# Patient Record
Sex: Male | Born: 1990 | Hispanic: Yes | Marital: Single | Smoking: Light tobacco smoker
Health system: Southern US, Community
[De-identification: ages and names within clinical notes are randomized; demographics above are authoritative.]

---
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
## 2011-02-13 ENCOUNTER — Emergency Department (HOSPITAL_COMMUNITY)
Admission: EM | Admit: 2011-02-13 | Discharge: 2011-02-13 | Disposition: A | Discharge status: Home / Self Care | Payer: Self-pay | Attending: Emergency Medicine | Admitting: Emergency Medicine

## 2011-02-13 ENCOUNTER — Emergency Department (HOSPITAL_COMMUNITY): Payer: Self-pay

## 2011-02-13 DIAGNOSIS — Y92009 Unspecified place in unspecified non-institutional (private) residence as the place of occurrence of the external cause: Secondary | ICD-10-CM | POA: Insufficient documentation

## 2011-02-13 DIAGNOSIS — R209 Unspecified disturbances of skin sensation: Secondary | ICD-10-CM | POA: Insufficient documentation

## 2011-02-13 DIAGNOSIS — M7989 Other specified soft tissue disorders: Secondary | ICD-10-CM | POA: Insufficient documentation

## 2011-02-13 DIAGNOSIS — M79609 Pain in unspecified limb: Secondary | ICD-10-CM | POA: Insufficient documentation

## 2011-02-13 DIAGNOSIS — IMO0002 Reserved for concepts with insufficient information to code with codable children: Secondary | ICD-10-CM | POA: Insufficient documentation

## 2011-02-13 DIAGNOSIS — M25649 Stiffness of unspecified hand, not elsewhere classified: Secondary | ICD-10-CM | POA: Insufficient documentation

## 2011-02-13 DIAGNOSIS — S62339A Displaced fracture of neck of unspecified metacarpal bone, initial encounter for closed fracture: Secondary | ICD-10-CM | POA: Insufficient documentation

## 2011-02-13 DIAGNOSIS — S60229A Contusion of unspecified hand, initial encounter: Secondary | ICD-10-CM | POA: Insufficient documentation

## 2012-01-04 IMAGING — CR DG HAND COMPLETE 3+V*R*
3 series · 3 of 3 positions shown · non-contrast
Comparison: None.

CLINICAL DATA: Trauma, pain

RIGHT HAND - COMPLETE 3+ VIEW

[x hand pa right]
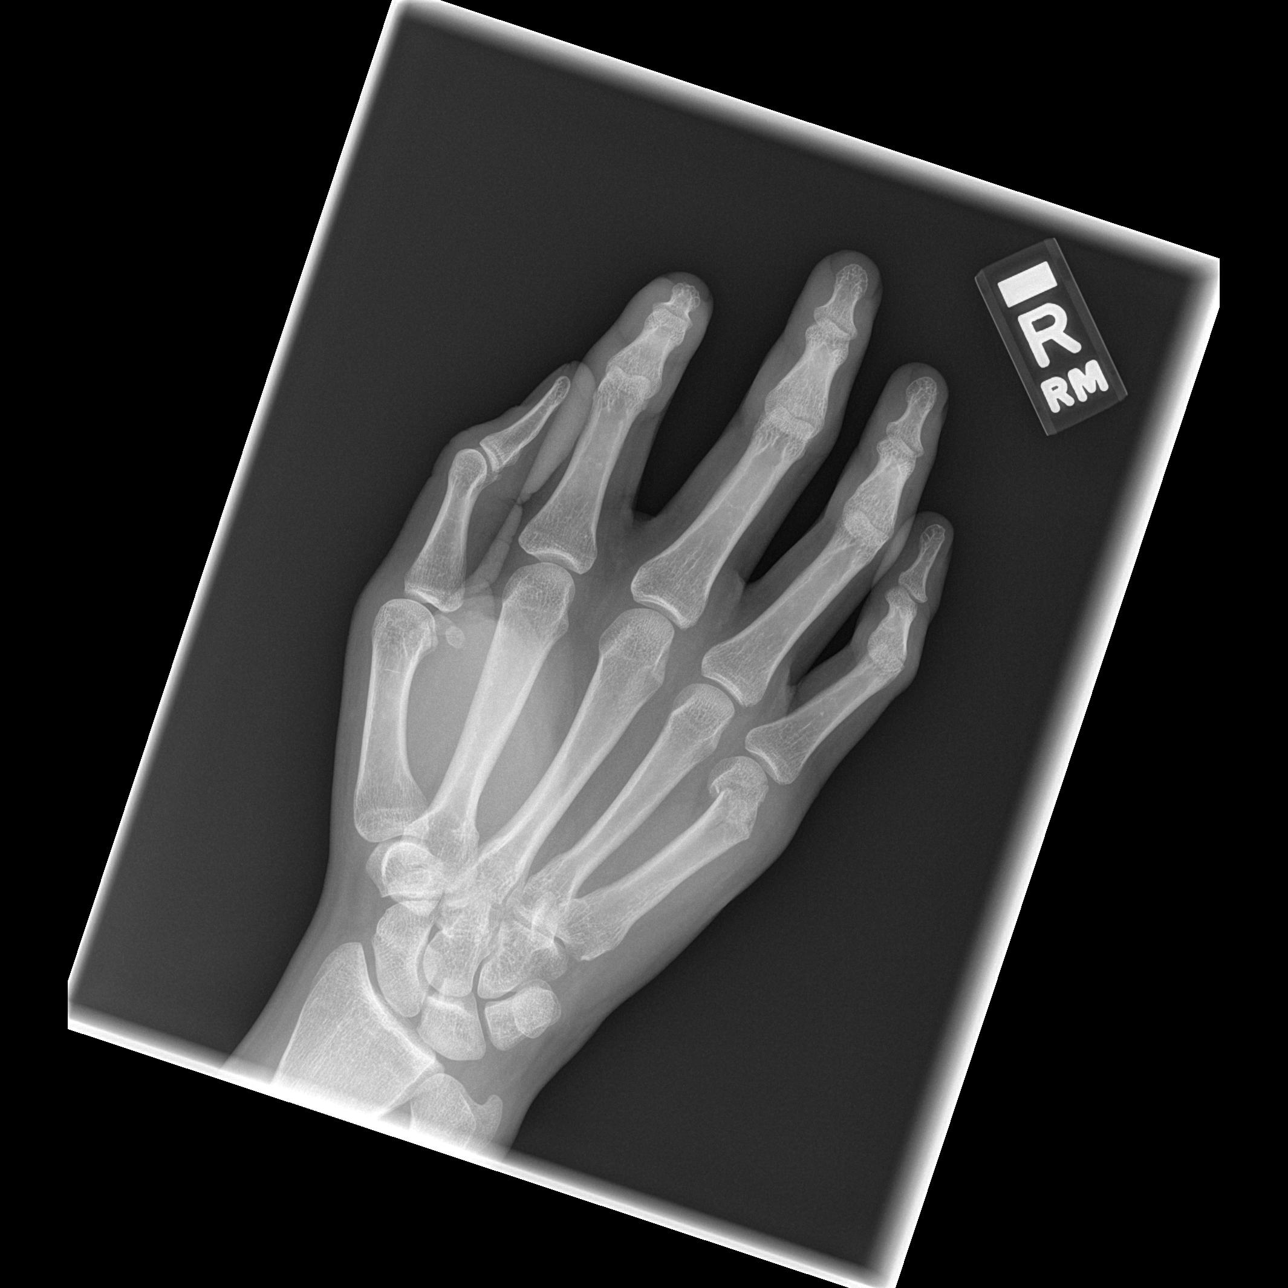

[x hand oblique right]
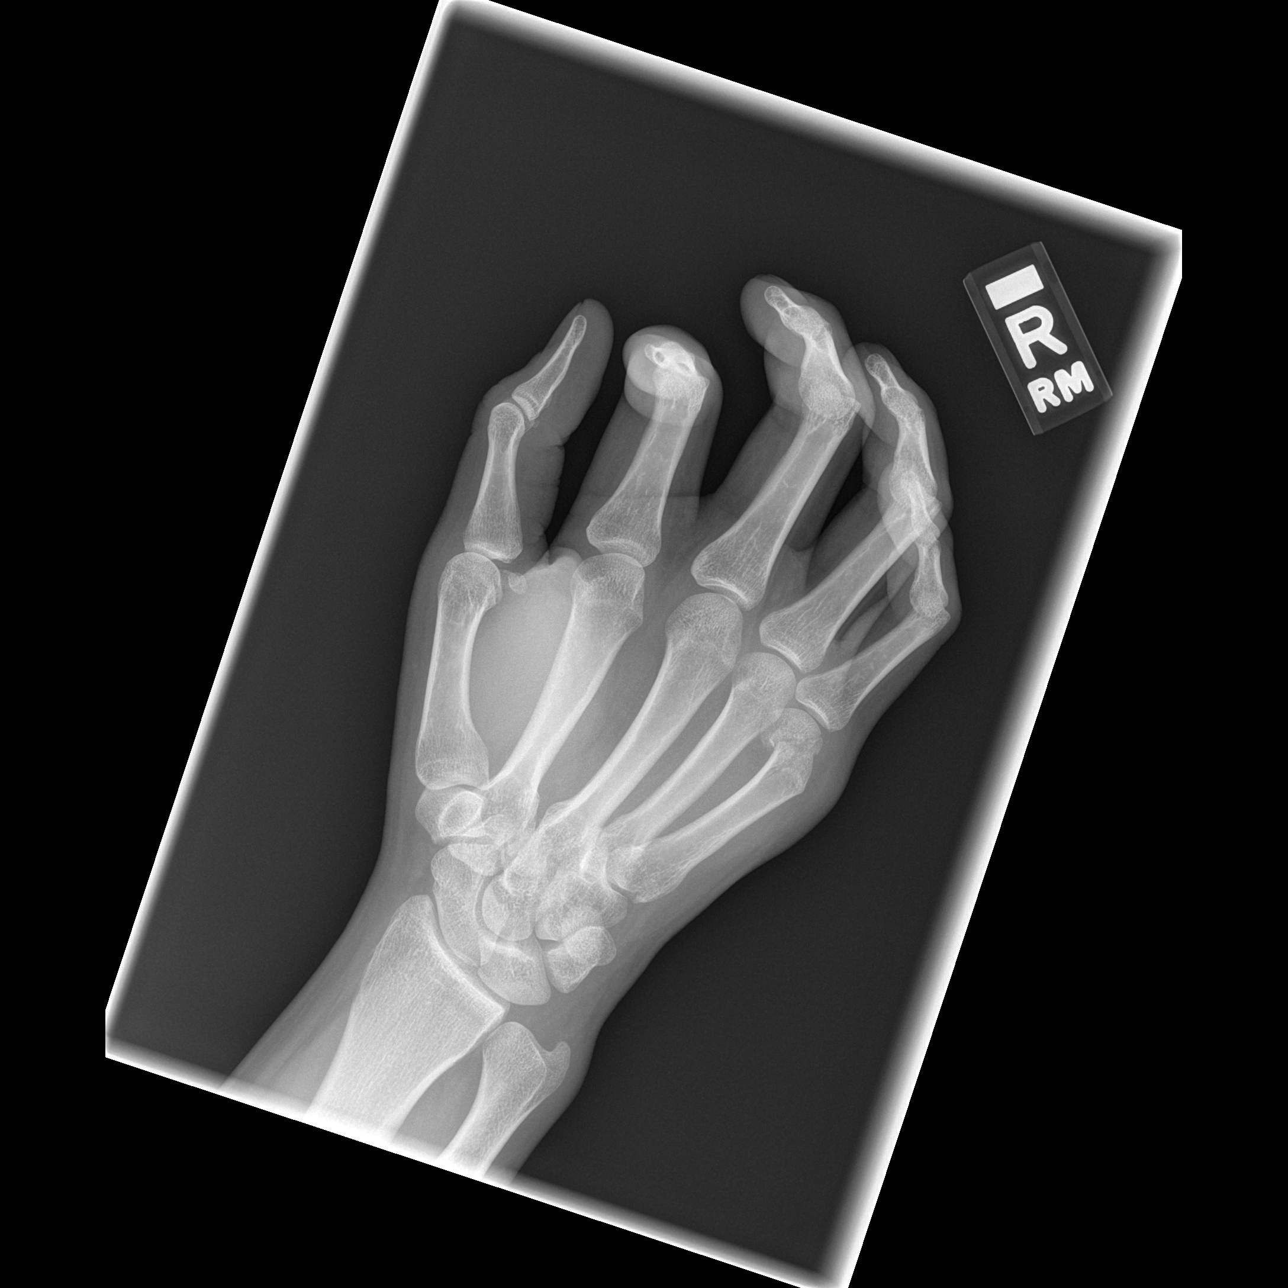

[x hand lat right]
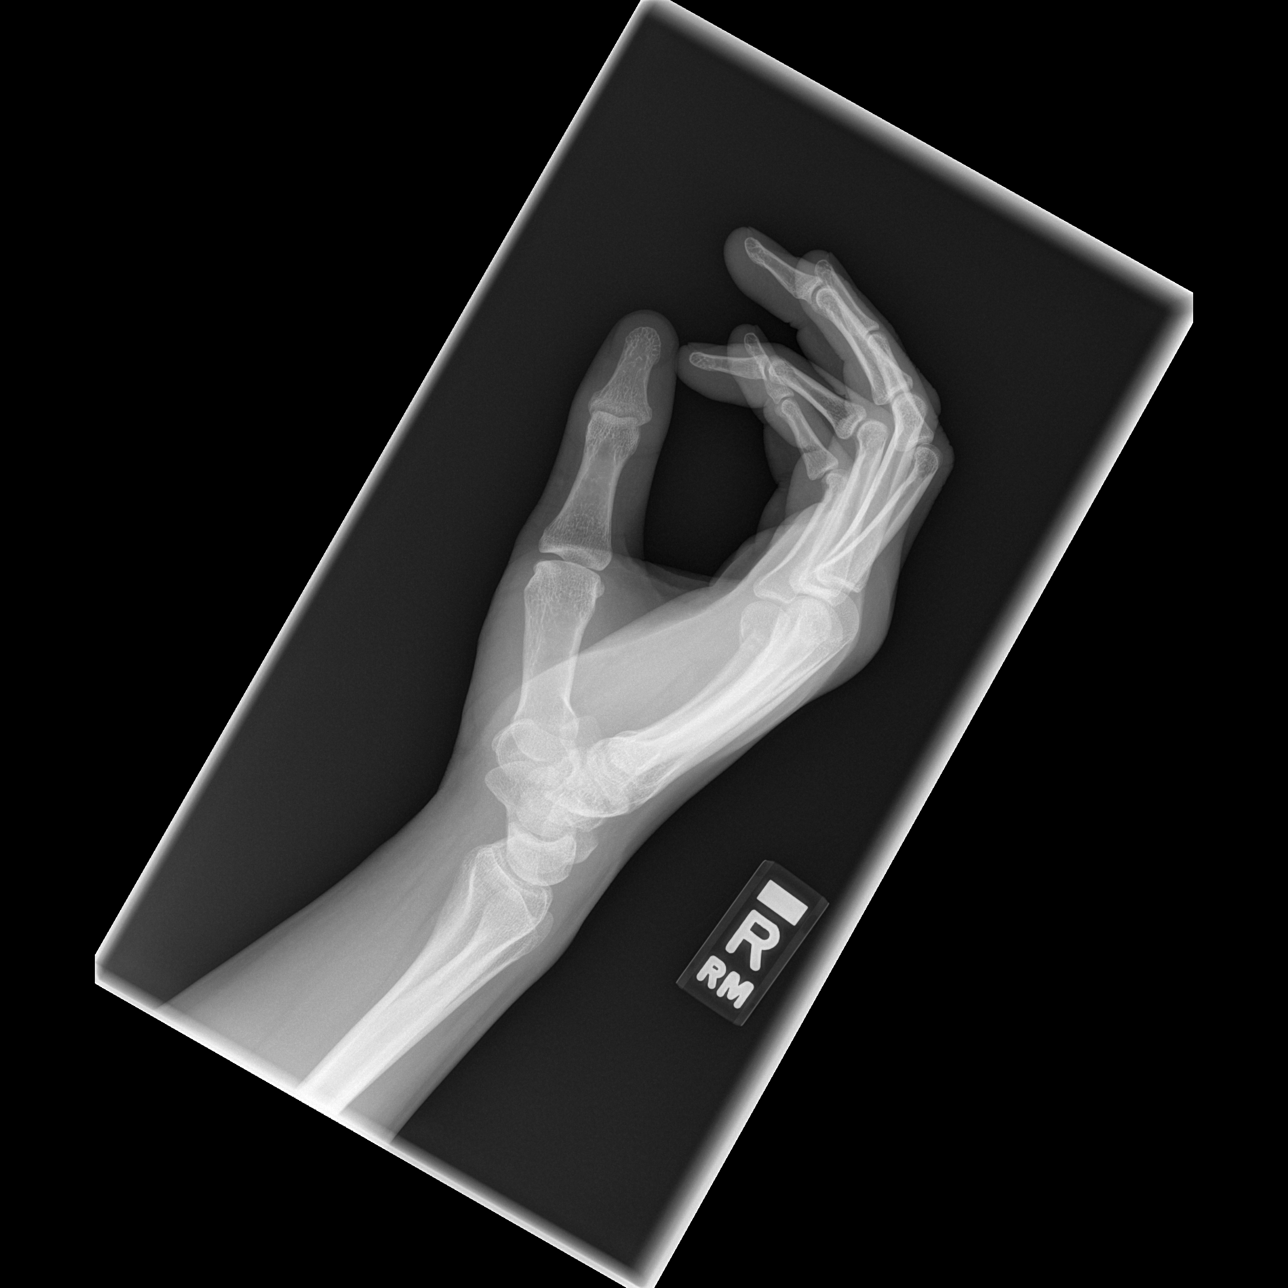

[3 of 3 positions shown; findings below may reference images not displayed]

FINDINGS: Acute angulated displaced fracture of the right fifth
metacarpal noted distally consistent with a "boxer's fracture."
Slight volar angulation of the distal fracture fragment.  Mild soft
tissue swelling.  No other osseous abnormality.
IMPRESSION: Acute right distal fifth metacarpal fracture.

## 2020-04-09 ENCOUNTER — Encounter: Payer: Self-pay | Admitting: Family Medicine

## 2020-04-09 ENCOUNTER — Other Ambulatory Visit: Payer: Self-pay

## 2020-04-09 ENCOUNTER — Ambulatory Visit: Payer: Self-pay | Admitting: Family Medicine

## 2020-04-09 DIAGNOSIS — Z113 Encounter for screening for infections with a predominantly sexual mode of transmission: Secondary | ICD-10-CM

## 2020-04-09 DIAGNOSIS — Z299 Encounter for prophylactic measures, unspecified: Secondary | ICD-10-CM

## 2020-04-09 LAB — GRAM STAIN

## 2020-04-09 MED ORDER — AZITHROMYCIN 500 MG PO TABS
1000.0000 mg | ORAL_TABLET | Freq: Once | ORAL | Status: AC
  Administered 2020-04-09: 1000 mg via ORAL

## 2020-04-09 NOTE — Progress Notes (Signed)
   [facility] Department STI clinic/screening visit  Subjective:  Duane Pearson is a 29 y.o. male being seen today for an STI screening visit. The patient reports they do have symptoms.    Patient has the following medical conditions:  There are no problems to display for this patient.    Chief Complaint  Patient presents with  . SEXUALLY TRANSMITTED DISEASE    HPI  Patient reports that he has had dysuria and itching on the inside of his urethra x3-4 days, states that today has been less intense.  States that he has had 1 partner and she doesn't have symptoms.  States this is his only partner since May when he was released from prison. Client also states that he may have a hernia- he sees a bulging in his pubic area and feel the pressure.   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  See flowsheet for further details and programmatic requirements.    The following portions of the patient's history were reviewed and updated as appropriate: allergies, current medications, past medical history, past social history, past surgical history and problem list.  Objective:  There were no vitals filed for this visit.  Physical Exam Constitutional:      Appearance: Normal appearance.  Abdominal:     Hernia: There is no hernia in the left inguinal area or right inguinal area.  Genitourinary:    Penis: Normal.      Testes: Normal.     Epididymis:     Right: Normal.     Left: Normal.     Comments: Penis- small amount of discharge noted on the gram stain slide. Lymphadenopathy:     Cervical: No cervical adenopathy.     Lower Body: No right inguinal adenopathy. No left inguinal adenopathy.  Neurological:     Mental Status: He is alert.       Assessment and Plan:  Duane Pearson is a 29 y.o. male presenting to the [facility] Department for STI screening  1. Screening examination for venereal disease  - Gonococcus culture - HIV/HCV [facility] - Syphilis  Serology, [place] [facility] - HBV Antigen/Antibody State Lab - Gram stain -Co to establish PCP for evaluation of hernia concerns.  Co that his hernia exam was WNL.   2. Prophylactic measure Will treat for NGU based on client's symptoms and exam. - azithromycin (ZITHROMAX) tablet 1,000 mg Co to inform partner that she will need evaluation and treatment for NGU Co to abstain from sex x 1 week and to always use condoms for STD prevention.    Return if symptoms worsen or fail to improve.  No future appointments.  Larene Pickett, FNP

## 2020-04-09 NOTE — Progress Notes (Signed)
Gram stain reviewed with provider and is negative today, so no treatment needed for gram stain per standing order. Pt received prophylactic treatment based on pt's symptoms with Azithromycin 1g po DOT per Larene Pickett, FNP verbal order. Counseled pt per provider orders and pt states understanding. Provider orders completed.

## 2020-04-14 LAB — GONOCOCCUS CULTURE

## 2020-04-16 ENCOUNTER — Encounter: Payer: Self-pay | Admitting: Family Medicine

## 2020-04-16 LAB — HM HIV SCREENING LAB: HM HIV Screening: NEGATIVE

## 2020-04-16 LAB — HM HEPATITIS C SCREENING LAB: HM Hepatitis Screen: NEGATIVE

## 2020-04-16 LAB — HEPATITIS B SURFACE ANTIGEN

## 2022-11-10 ENCOUNTER — Ambulatory Visit: Payer: Self-pay | Admitting: Family Medicine

## 2022-11-10 ENCOUNTER — Encounter: Payer: Self-pay | Admitting: Family Medicine

## 2022-11-10 DIAGNOSIS — Z113 Encounter for screening for infections with a predominantly sexual mode of transmission: Secondary | ICD-10-CM

## 2022-11-10 LAB — HM HEPATITIS C SCREENING LAB: HM Hepatitis Screen: NEGATIVE

## 2022-11-10 LAB — HEPATITIS B SURFACE ANTIGEN

## 2022-11-10 LAB — GRAM STAIN

## 2022-11-10 LAB — HM HIV SCREENING LAB: HM HIV Screening: NEGATIVE

## 2022-11-10 NOTE — Progress Notes (Addendum)
[facility] Department STI clinic/screening visit  Subjective:  Duane Pearson is a 32 y.o. male being seen today for an STI screening visit. The patient reports they do have symptoms.    Patient has the following medical conditions:  There are no problems to display for this patient.    Chief Complaint  Patient presents with   Penis Pain    Painful, swollen and pinkish on penis     Penis Pain The patient's primary symptoms include penile pain.    Patient reports to clinic with c/o burning, swelling on his penis x 1 week  Last HIV test per patient/review of record was  Lab Results  Component Value Date   HMHIVSCREEN Negative - Validated 04/16/2020   No results found for: "HIV"  Does the patient or their partner desires a pregnancy in the next year? No  Screening for MPX risk: Does the patient have an unexplained rash? No Is the patient MSM? No Does the patient endorse multiple sex partners or anonymous sex partners? No Did the patient have close or sexual contact with a person diagnosed with MPX? No Has the patient traveled outside the [place] where MPX is endemic? No Is there a high clinical suspicion for MPX-- evidenced by one of the following No  -Unlikely to be chickenpox  -Lymphadenopathy  -Rash that present in same phase of evolution on any given body part   See flowsheet for further details and programmatic requirements.    There is no immunization history on file for this patient.   The following portions of the patient's history were reviewed and updated as appropriate: allergies, current medications, past medical history, past social history, past surgical history and problem list.  Objective:  There were no vitals filed for this visit.  Physical Exam Constitutional:      Appearance: Normal appearance.  HENT:     Head: Normocephalic and atraumatic.     Comments: No nits or hair loss    Mouth/Throat:     Mouth: Mucous membranes are moist. No  oral lesions.     Pharynx: Oropharynx is clear. No oropharyngeal exudate or posterior oropharyngeal erythema.  Eyes:     General:        Right eye: No discharge.        Left eye: No discharge.     Conjunctiva/sclera:     Right eye: Right conjunctiva is not injected. No exudate.    Left eye: Left conjunctiva is not injected. No exudate. Pulmonary:     Effort: Pulmonary effort is normal.  Abdominal:     General: Abdomen is flat.     Palpations: Abdomen is soft. There is no hepatomegaly or mass.     Tenderness: There is no abdominal tenderness. There is no rebound.     Hernia: There is no hernia in the left inguinal area or right inguinal area.  Genitourinary:    Pubic Area: No rash or pubic lice (no nits).      Penis: Normal. No tenderness, discharge, swelling or lesions.      Testes: Normal.     Epididymis:     Right: Normal. No mass or tenderness.     Left: Normal. No mass or tenderness.     Rectum: Normal. No tenderness (no lesions or discharge).       Comments: Penile Discharge Amount: none  Color:  none  Redness and swelling present at tip of penis, no lesions Lymphadenopathy:     Head:  Right side of head: No preauricular or posterior auricular adenopathy.     Left side of head: No preauricular or posterior auricular adenopathy.     Cervical: No cervical adenopathy.     Upper Body:     Right upper body: No supraclavicular, axillary or epitrochlear adenopathy.     Left upper body: No supraclavicular, axillary or epitrochlear adenopathy.     Lower Body: No right inguinal adenopathy. No left inguinal adenopathy.  Skin:    General: Skin is warm and dry.     Findings: No lesion or rash.  Neurological:     Mental Status: He is alert and oriented to person, place, and time.       Assessment and Plan:  Duane Pearson is a 32 y.o. male presenting to the [facility] Department for STI screening  1. Screening for venereal disease  - HIV/HCV [place]  Lab - Syphilis Serology, [place] - Chlamydia/GC NAA, Confirmation - HBV Antigen/Antibody State Lab - Gram stain   Patient does have STI symptoms Patient accepted all screenings including  urine GC/Chlamydia, and blood work for HIV/Syphilis. Patient meets criteria for HepB screening? Yes. Ordered? yes Patient meets criteria for HepC screening? Yes. Ordered? yes Recommended condom use with all sex Discussed importance of condom use for STI prevent  Treat positive test results per standing order. Discussed time line for State Lab results and that patient will be called with positive results and encouraged patient to call if he had not heard in 2 weeks Recommended repeat testing in 3 months with positive results. Recommended returning for continued or worsening symptoms.   Return if symptoms worsen or fail to improve, for STI screening.  No future appointments. Total time spent 20 minutes Lenice Llamas, [place]

## 2022-11-10 NOTE — Progress Notes (Signed)
Pt is here for STD screening.  He is having sx's.  Gram stain results reviewed, no treatment required per SO.  Condoms given.  Berdie Ogren, RN

## 2022-11-13 LAB — CHLAMYDIA/GC NAA, CONFIRMATION
Chlamydia trachomatis, NAA: NEGATIVE
Neisseria gonorrhoeae, NAA: NEGATIVE

## 2023-09-13 NOTE — ED Provider Notes (Signed)
 [facility] Emergency Department Provider Note    ED Clinical Impression    Final diagnoses:  Epigastric pain (Primary)  Nausea and vomiting, unspecified vomiting type       Impression, Medical Decision Making, Progress Notes and Critical Care    Impression, Differential Diagnosis and Plan of Care  Duane Pearson is a 33 y.o. male with no pertinent past medical history who presents with worsened epigastric abdominal pain, nausea, and vomiting since he was last seen in this ED 2 days ago and diagnosed with acute pancreatitis as described below.  Vital signs on arrival w/ no noted tachycardia or tachypnea, patient is afebrile. On exam, abdomen is diffusely tender to palpation, most predominantly in the epigastrium. Abdomen is otherwise soft and non-distended.  Differential includes versus gastritis versus worsening pancreatitis versus biliary pathology versus pancreatic cyst versus pancreatic pseudocyst.  Labs from triage without leukocytosis, anemia, electrolyte abnormalities, or abnormal kidney function. LFTs are slightly elevated, down-trending from when he was seen in the ED 2 days ago. UA non-concerning for UTI. Lipase WNL. Plan to additionally obtain RUQ US . Will treat patient with Morphine, Pepcid, and Zofran.  Independent Interpretation of Studies: I have independently reviewed EKG and note normal sinus rhythm at 71, normal axis, normal interval, STEMI. I have independently reviewed CT A/P and note no significant pancreatitis.[facility] External Records Reviewed: n/a  Social determinants that significantly affected care: None applicable History obtained from other sources: None  Additional Progress Notes  7:20 PM RUQ US  without evidence of acute biliary pathogen. Will trial GI cocktail to see if this improves symptoms.   8:19 PM Re-assessed the patient who states that his pain was temporarily improved with the GI cocktail, though has begun to worsen again. Given  that his pain has remained persistent and minimally improved with multiple medications, will repeat CT A/P to re-evaluate for potential intra-abdominal pathologies. Toradol  ordered for pain control. Will also provide with IV fluids.  9:57 PM CT A/P w/o acute abnormalities. Etiology of symptoms is unclear at this time, though workup has been largely reassuring.  Patient does report feeling improved after Toradol .  Recommended that the patient continue supportive care at home for his symptoms and follow-up with PCP for further management and workup. He states that his symptoms have improved with the medications he has received in the ED. Will discharge with prescriptions for Pepcid and Carafate. Patient is amenable to discharge w/ this plan. Return precautions discussed w/ patient who expresses understanding and agreement to plan. They deny any questions or concerns. NAD noted upon discharge.  Portions of this record have been created using Scientist, clinical (histocompatibility and immunogenetics). Dictation errors have been sought, but may not have been identified and corrected.  See chart and resident provider documentation for details.  ____________________________________________      History     Reason for Visit Abdominal Pain   HPI  Duane Pearson is a 33 y.o. male with no pertinent past medical history who presents to the ED for evaluation of epigastric abdominal pain, nausea, and vomiting. Per chart review, the patient was seen in this ED 2 days ago for same. His workup at that time showed subtle peripancreatic stranding c/f acute pancreatitis. He was discharged home with supportive care instructions for this. Since being discharged home, he notes that his pain has worsened and that he has continued to have nausea and vomiting making it difficult for him to tolerate PO intake. He has tried taking Tylenol for his pain, though  this has provided little to no relief. He has not had a bowel movement since he was last seen in  the ED. No history of abdominal surgeries. Denies urinary symptoms. He has not been febrile recently. Additionally, he notes that he has had headaches over the past few days. He also has developed a productive cough with clear/yellow sputum.   No past medical history on file.  There is no problem list on file for this patient.   No past surgical history on file.  No current facility-administered medications for this encounter. No current outpatient medications on file.  Allergies Patient has no known allergies.  No family history on file.  Social History Social History   Tobacco Use   Smoking status: Never   Smokeless tobacco: Never  Substance Use Topics   Alcohol use: Never   Drug use: Yes    Types: Marijuana     Physical Exam   This provider entered the patient's room: YES  If this provider did not enter the room, a comprehensive physical exam was not able to be performed due to increased infection risk to themselves, other providers, staff and other patients), as well as to conserve personal protective equipment (PPE) utilization during the COVID-19 pandemic.  If this provider did enter the patient room, the following was PPE worn: Surgical mask, eye protection and gloves   BP 150/94   Pulse 67   Temp 36.6 C (97.8 F) (Temporal)   Resp 18   Wt (!) 120.7 kg (266 lb)   SpO2 100%   Constitutional: Alert and oriented. Well appearing and in no distress. Eyes: Conjunctivae are normal. ENT      Head: Normocephalic and atraumatic.      Nose: No congestion.      Mouth/Throat: Mucous membranes are moist.      Neck: No stridor. Hematological/Lymphatic/Immunilogical: No cervical lymphadenopathy. Cardiovascular: Normal rate, regular rhythm. Normal and symmetric distal pulses are present in all extremities. Respiratory: Normal respiratory effort. Breath sounds are normal. Gastrointestinal: Abdomen is diffusely tender to palpation, most predominantly in the epigastrium.  Abdomen is otherwise soft and non-distended. There is no CVA tenderness. Musculoskeletal: Normal range of motion in all extremities.      Right lower leg: No tenderness or edema.      Left lower leg: No tenderness or edema. Neurologic: Normal speech and language. No gross focal neurologic deficits are appreciated. Skin: Skin is warm, dry and intact. No rash noted. Psychiatric: Mood and affect are normal. Speech and behavior are normal.   Radiology   CT Abdomen Pelvis W IV Contrast Only  Final Result  No evidence of acute intra-abdominal/pelvic pathology.      US  RUQ W Gallbladder  Final Result  No gallstones or biliary dilatation.              Procedures   N/A  Documentation assistance was provided by Rankin Sizer, Scribe, on September 13, 2023 at 6:14 PM for Hoy Slade, MD.  September 17, 2023 2:42 PM. Documentation assistance provided by the scribe. I was present during the time the encounter was recorded. The information recorded by the scribe was done at my direction and has been reviewed and validated by me.     Slade Hoy Cutting, MD 09/17/23 [phone]

## 2024-08-13 ENCOUNTER — Other Ambulatory Visit: Payer: Self-pay

## 2024-08-13 ENCOUNTER — Emergency Department: Payer: Self-pay

## 2024-08-13 ENCOUNTER — Encounter: Payer: Self-pay | Admitting: Emergency Medicine

## 2024-08-13 ENCOUNTER — Emergency Department
Admission: EM | Admit: 2024-08-13 | Discharge: 2024-08-13 | Disposition: A | Discharge status: Home / Self Care | Payer: Self-pay

## 2024-08-13 DIAGNOSIS — Y92410 Unspecified street and highway as the place of occurrence of the external cause: Secondary | ICD-10-CM | POA: Insufficient documentation

## 2024-08-13 DIAGNOSIS — S39012A Strain of muscle, fascia and tendon of lower back, initial encounter: Secondary | ICD-10-CM | POA: Diagnosis present

## 2024-08-13 MED ORDER — METHOCARBAMOL 500 MG PO TABS: 500.0000 mg | ORAL_TABLET | Freq: Three times a day (TID) | ORAL | 0 refills | Status: AC | PRN

## 2024-08-13 MED ORDER — METHOCARBAMOL 500 MG PO TABS
1000.0000 mg | ORAL_TABLET | Freq: Once | ORAL | Status: AC
  Administered 2024-08-13: 1000 mg via ORAL
  Filled 2024-08-13: qty 2

## 2024-08-13 MED ORDER — KETOROLAC TROMETHAMINE 15 MG/ML IJ SOLN
15.0000 mg | Freq: Once | INTRAMUSCULAR | Status: DC
  Filled 2024-08-13: qty 1

## 2024-08-13 MED ORDER — MELOXICAM 15 MG PO TABS: 15.0000 mg | ORAL_TABLET | Freq: Every day | ORAL | 0 refills | Status: AC

## 2024-08-13 MED ORDER — KETOROLAC TROMETHAMINE 15 MG/ML IJ SOLN
15.0000 mg | Freq: Once | INTRAMUSCULAR | Status: AC
  Administered 2024-08-13: 15 mg via INTRAMUSCULAR
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
